# Patient Record
Sex: Female | Born: 1955 | Race: White | Hispanic: Yes | Marital: Married | Smoking: Never smoker
Health system: Southern US, Community
[De-identification: ages and names within clinical notes are randomized; demographics above are authoritative.]

## PROBLEM LIST (undated history)

## (undated) DIAGNOSIS — G8929 Other chronic pain: Secondary | ICD-10-CM

## (undated) DIAGNOSIS — R519 Headache, unspecified: Secondary | ICD-10-CM

## (undated) DIAGNOSIS — R51 Headache: Secondary | ICD-10-CM

## (undated) HISTORY — DX: Headache: R51

## (undated) HISTORY — DX: Other chronic pain: G89.29

## (undated) HISTORY — DX: Headache, unspecified: R51.9

---

## 2001-06-03 ENCOUNTER — Ambulatory Visit (HOSPITAL_COMMUNITY): Admission: RE | Admit: 2001-06-03 | Discharge: 2001-06-03 | Payer: Self-pay | Admitting: Family Medicine

## 2001-06-03 ENCOUNTER — Encounter: Payer: Self-pay | Admitting: Family Medicine

## 2003-12-22 ENCOUNTER — Ambulatory Visit: Payer: Self-pay | Admitting: *Deleted

## 2004-05-28 ENCOUNTER — Ambulatory Visit: Payer: Self-pay | Admitting: Family Medicine

## 2004-06-06 ENCOUNTER — Ambulatory Visit (HOSPITAL_COMMUNITY): Admission: RE | Admit: 2004-06-06 | Discharge: 2004-06-06 | Payer: Self-pay | Admitting: Family Medicine

## 2004-06-19 ENCOUNTER — Ambulatory Visit: Payer: Self-pay | Admitting: Family Medicine

## 2004-08-08 ENCOUNTER — Ambulatory Visit: Payer: Self-pay | Admitting: Family Medicine

## 2004-09-05 ENCOUNTER — Ambulatory Visit: Payer: Self-pay | Admitting: Family Medicine

## 2005-03-26 ENCOUNTER — Ambulatory Visit: Payer: Self-pay | Admitting: Family Medicine

## 2005-06-07 ENCOUNTER — Ambulatory Visit (HOSPITAL_COMMUNITY): Admission: RE | Admit: 2005-06-07 | Discharge: 2005-06-07 | Payer: Self-pay | Admitting: Internal Medicine

## 2006-05-06 ENCOUNTER — Ambulatory Visit: Payer: Self-pay | Admitting: Nurse Practitioner

## 2006-05-21 ENCOUNTER — Ambulatory Visit: Payer: Self-pay | Admitting: Family Medicine

## 2006-06-02 ENCOUNTER — Ambulatory Visit: Payer: Self-pay | Admitting: Family Medicine

## 2006-06-02 ENCOUNTER — Encounter (INDEPENDENT_AMBULATORY_CARE_PROVIDER_SITE_OTHER): Payer: Self-pay | Admitting: Family Medicine

## 2007-01-07 ENCOUNTER — Encounter (INDEPENDENT_AMBULATORY_CARE_PROVIDER_SITE_OTHER): Payer: Self-pay | Admitting: *Deleted

## 2007-04-29 ENCOUNTER — Ambulatory Visit (HOSPITAL_COMMUNITY): Admission: RE | Admit: 2007-04-29 | Discharge: 2007-04-29 | Payer: Self-pay | Admitting: Internal Medicine

## 2007-05-11 ENCOUNTER — Ambulatory Visit: Payer: Self-pay | Admitting: Internal Medicine

## 2007-06-01 ENCOUNTER — Ambulatory Visit: Payer: Self-pay | Admitting: Family Medicine

## 2007-06-30 ENCOUNTER — Ambulatory Visit: Payer: Self-pay | Admitting: Internal Medicine

## 2007-06-30 LAB — CONVERTED CEMR LAB
ALT: 14 units/L (ref 0–35)
AST: 15 units/L (ref 0–37)
Albumin: 4.5 g/dL (ref 3.5–5.2)
Alkaline Phosphatase: 72 units/L (ref 39–117)
BUN: 15 mg/dL (ref 6–23)
Basophils Absolute: 0 10*3/uL (ref 0.0–0.1)
Basophils Relative: 1 % (ref 0–1)
CO2: 25 meq/L (ref 19–32)
Calcium: 8.6 mg/dL (ref 8.4–10.5)
Chloride: 105 meq/L (ref 96–112)
Cholesterol: 210 mg/dL — ABNORMAL HIGH (ref 0–200)
Creatinine, Ser: 0.69 mg/dL (ref 0.40–1.20)
Eosinophils Absolute: 0.1 10*3/uL (ref 0.0–0.7)
Eosinophils Relative: 3 % (ref 0–5)
Glucose, Bld: 105 mg/dL — ABNORMAL HIGH (ref 70–99)
HCT: 41.4 % (ref 36.0–46.0)
HDL: 65 mg/dL (ref >39)
Hemoglobin: 13.5 g/dL (ref 12.0–15.0)
LDL Cholesterol: 132 mg/dL — ABNORMAL HIGH (ref 0–99)
Lymphocytes Relative: 38 % (ref 12–46)
Lymphs Abs: 1.5 10*3/uL (ref 0.7–4.0)
MCHC: 32.6 g/dL (ref 30.0–36.0)
MCV: 90.8 fL (ref 78.0–100.0)
Monocytes Absolute: 0.2 10*3/uL (ref 0.1–1.0)
Monocytes Relative: 6 % (ref 3–12)
Neutro Abs: 2 10*3/uL (ref 1.7–7.7)
Neutrophils Relative %: 53 % (ref 43–77)
Platelets: 214 10*3/uL (ref 150–400)
Potassium: 4.3 meq/L (ref 3.5–5.3)
RBC: 4.56 M/uL (ref 3.87–5.11)
RDW: 13.4 % (ref 11.5–15.5)
Sodium: 142 meq/L (ref 135–145)
TSH: 1.363 microintl units/mL (ref 0.350–5.50)
Total Bilirubin: 0.7 mg/dL (ref 0.3–1.2)
Total CHOL/HDL Ratio: 3.2
Total Protein: 7.4 g/dL (ref 6.0–8.3)
Triglycerides: 66 mg/dL (ref <150)
VLDL: 13 mg/dL (ref 0–40)
WBC: 3.8 10*3/uL — ABNORMAL LOW (ref 4.0–10.5)

## 2007-07-08 ENCOUNTER — Ambulatory Visit: Payer: Self-pay | Admitting: Internal Medicine

## 2012-05-27 ENCOUNTER — Other Ambulatory Visit: Payer: Self-pay | Admitting: Family Medicine

## 2012-05-27 ENCOUNTER — Other Ambulatory Visit (HOSPITAL_COMMUNITY)
Admission: RE | Admit: 2012-05-27 | Discharge: 2012-05-27 | Disposition: A | Discharge status: Home / Self Care | Payer: Commercial Managed Care - PPO | Source: Ambulatory Visit | Attending: Family Medicine | Admitting: Family Medicine

## 2012-05-27 DIAGNOSIS — Z1151 Encounter for screening for human papillomavirus (HPV): Secondary | ICD-10-CM | POA: Insufficient documentation

## 2012-05-27 DIAGNOSIS — Z124 Encounter for screening for malignant neoplasm of cervix: Secondary | ICD-10-CM | POA: Insufficient documentation

## 2012-05-28 ENCOUNTER — Other Ambulatory Visit: Payer: Self-pay | Admitting: Family Medicine

## 2012-05-28 DIAGNOSIS — R102 Pelvic and perineal pain: Secondary | ICD-10-CM

## 2012-06-01 ENCOUNTER — Ambulatory Visit
Admission: RE | Admit: 2012-06-01 | Discharge: 2012-06-01 | Disposition: A | Discharge status: Home / Self Care | Payer: Commercial Managed Care - PPO | Source: Ambulatory Visit | Attending: Family Medicine | Admitting: Family Medicine

## 2012-06-01 DIAGNOSIS — R102 Pelvic and perineal pain: Secondary | ICD-10-CM

## 2012-07-21 ENCOUNTER — Other Ambulatory Visit: Payer: Self-pay | Admitting: Obstetrics and Gynecology

## 2014-06-22 ENCOUNTER — Other Ambulatory Visit: Payer: Self-pay | Admitting: Family Medicine

## 2014-06-22 DIAGNOSIS — Z1231 Encounter for screening mammogram for malignant neoplasm of breast: Secondary | ICD-10-CM

## 2014-06-27 ENCOUNTER — Ambulatory Visit
Admission: RE | Admit: 2014-06-27 | Discharge: 2014-06-27 | Disposition: A | Discharge status: Home / Self Care | Payer: Commercial Managed Care - PPO | Source: Ambulatory Visit | Attending: Family Medicine | Admitting: Family Medicine

## 2014-06-27 DIAGNOSIS — Z1231 Encounter for screening mammogram for malignant neoplasm of breast: Secondary | ICD-10-CM

## 2015-03-07 ENCOUNTER — Other Ambulatory Visit: Payer: Self-pay | Admitting: General Surgery

## 2015-03-07 DIAGNOSIS — R1031 Right lower quadrant pain: Secondary | ICD-10-CM

## 2015-03-14 ENCOUNTER — Ambulatory Visit
Admission: RE | Admit: 2015-03-14 | Discharge: 2015-03-14 | Disposition: A | Discharge status: Home / Self Care | Payer: Commercial Managed Care - PPO | Source: Ambulatory Visit | Attending: General Surgery | Admitting: General Surgery

## 2015-03-14 DIAGNOSIS — R1031 Right lower quadrant pain: Secondary | ICD-10-CM

## 2018-06-01 ENCOUNTER — Emergency Department (HOSPITAL_BASED_OUTPATIENT_CLINIC_OR_DEPARTMENT_OTHER): Payer: Commercial Managed Care - PPO

## 2018-06-01 ENCOUNTER — Emergency Department (HOSPITAL_BASED_OUTPATIENT_CLINIC_OR_DEPARTMENT_OTHER)
Admission: EM | Admit: 2018-06-01 | Discharge: 2018-06-01 | Disposition: A | Discharge status: Home / Self Care | Payer: Commercial Managed Care - PPO | Attending: Emergency Medicine | Admitting: Emergency Medicine

## 2018-06-01 ENCOUNTER — Other Ambulatory Visit: Payer: Self-pay

## 2018-06-01 ENCOUNTER — Encounter (HOSPITAL_BASED_OUTPATIENT_CLINIC_OR_DEPARTMENT_OTHER): Payer: Self-pay | Admitting: *Deleted

## 2018-06-01 DIAGNOSIS — R51 Headache: Secondary | ICD-10-CM | POA: Diagnosis not present

## 2018-06-01 DIAGNOSIS — R079 Chest pain, unspecified: Secondary | ICD-10-CM

## 2018-06-01 DIAGNOSIS — R0789 Other chest pain: Secondary | ICD-10-CM | POA: Diagnosis not present

## 2018-06-01 DIAGNOSIS — R9431 Abnormal electrocardiogram [ECG] [EKG]: Secondary | ICD-10-CM | POA: Diagnosis not present

## 2018-06-01 DIAGNOSIS — I4581 Long QT syndrome: Secondary | ICD-10-CM | POA: Diagnosis not present

## 2018-06-01 DIAGNOSIS — R519 Headache, unspecified: Secondary | ICD-10-CM

## 2018-06-01 LAB — CBC
HEMATOCRIT: 41.7 % (ref 36.0–46.0)
HEMOGLOBIN: 13.2 g/dL (ref 12.0–15.0)
MCH: 28.6 pg (ref 26.0–34.0)
MCHC: 31.7 g/dL (ref 30.0–36.0)
MCV: 90.3 fL (ref 80.0–100.0)
Platelets: 263 10*3/uL (ref 150–400)
RBC: 4.62 MIL/uL (ref 3.87–5.11)
RDW: 12.5 % (ref 11.5–15.5)
WBC: 5.8 10*3/uL (ref 4.0–10.5)
nRBC: 0 % (ref 0.0–0.2)

## 2018-06-01 LAB — BASIC METABOLIC PANEL
Anion gap: 7 (ref 5–15)
BUN: 16 mg/dL (ref 8–23)
CO2: 24 mmol/L (ref 22–32)
CREATININE: 0.63 mg/dL (ref 0.44–1.00)
Calcium: 8.9 mg/dL (ref 8.9–10.3)
Chloride: 104 mmol/L (ref 98–111)
GFR calc Af Amer: >60 mL/min (ref >60)
GFR calc non Af Amer: >60 mL/min (ref >60)
GLUCOSE: 107 mg/dL — AB (ref 70–99)
POTASSIUM: 3.8 mmol/L (ref 3.5–5.1)
SODIUM: 135 mmol/L (ref 135–145)

## 2018-06-01 LAB — TROPONIN I: Troponin I: <0.03 ng/mL (ref <0.03)

## 2018-06-01 MED ORDER — PROCHLORPERAZINE MALEATE 10 MG PO TABS
10.0000 mg | ORAL_TABLET | Freq: Once | ORAL | Status: AC
  Administered 2018-06-01: 10 mg via ORAL
  Filled 2018-06-01: qty 1

## 2018-06-01 MED ORDER — SODIUM CHLORIDE 0.9% FLUSH
3.0000 mL | Freq: Once | INTRAVENOUS | Status: DC
  Filled 2018-06-01: qty 3

## 2018-06-01 MED ORDER — DIPHENHYDRAMINE HCL 25 MG PO CAPS
25.0000 mg | ORAL_CAPSULE | Freq: Once | ORAL | Status: AC
  Administered 2018-06-01: 25 mg via ORAL
  Filled 2018-06-01: qty 1

## 2018-06-01 NOTE — ED Triage Notes (Signed)
Chest pain on and off x 6 months. She was seen at [facility] today and told to come back in 3 days for a recheck. Son asked for a cardiology referral and they were told to come the the ED.

## 2018-06-01 NOTE — ED Provider Notes (Signed)
[facility] EMERGENCY DEPARTMENT Provider Note   CSN: [id number] Arrival date & time: 06/01/18  1853     History   Chief Complaint Chief Complaint  Patient presents with  . Chest Pain    HPI Dawn Mayer is a 63 y.o. female.  HPI  Started early Sunday, was having migraine, headache, nausea, and then began to have chest pain from right shoulder to middle of the chest, like a pressure Felt like a pressure that was there for a few hours Came back a little bit again, was working and felt it a little bit and then it went away Has had this on and off for a while  Headache persistent, not had like this before, has had headaches her whole life, this one began Sunday woke her up, has been decreasing, mild right now   No current chest pain  CP has been on and off for months, maybe a year. Sometimes will last 4 days then go away.  Thinks maybe it is stress.  Has not seen Cardiologist No smoking, many years ago, no other drugs  Father had died in accident. Mom no cardiac disease. No other medical problems.   History reviewed. No pertinent past medical history.  There are no active problems to display for this patient.   History reviewed. No pertinent surgical history.   OB History   No obstetric history on file.      Home Medications    Prior to Admission medications   Not on File    Family History No family history on file.  Social History Social History   Tobacco Use  . Smoking status: Never Smoker  . Smokeless tobacco: Never Used  Substance Use Topics  . Alcohol use: Never    Frequency: Never  . Drug use: Never     Allergies   Patient has no known allergies.   Review of Systems Review of Systems  Constitutional: Negative for fever.  HENT: Negative for sore throat.   Eyes: Negative for visual disturbance.  Respiratory: Negative for cough and shortness of breath.   Cardiovascular: Positive for chest pain.  Gastrointestinal: Positive  for nausea and vomiting. Negative for abdominal pain.  Genitourinary: Negative for difficulty urinating.  Musculoskeletal: Negative for back pain and neck pain.  Skin: Negative for rash.  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 Negative for syncope.     Physical Exam Updated Vital Signs BP 140/78   Pulse 82   Temp 98.2 F (36.8 C) (Oral)   Resp 18   Ht 5\' 4"  (1.626 m)   Wt 71.7 kg   SpO2 100%   BMI 27.12 kg/m   Physical Exam Vitals signs and nursing note reviewed.  Constitutional:      General: She is not in acute distress.    Appearance: She is well-developed. She is not diaphoretic.  HENT:     Head: Normocephalic and atraumatic.  Eyes:     Conjunctiva/sclera: Conjunctivae normal.  Neck:     Musculoskeletal: Normal range of motion.  Cardiovascular:     Rate and Rhythm: Normal rate and regular rhythm.     Heart sounds: Normal heart sounds. No murmur. No friction rub. No gallop.   Pulmonary:     Effort: Pulmonary effort is normal. No respiratory distress.     Breath sounds: Normal breath sounds. No wheezing or rales.  Abdominal:     General: There is no distension.     Palpations: Abdomen is soft.  Tenderness: There is no abdominal tenderness. There is no guarding.  Musculoskeletal:        General: No tenderness.  Skin:    General: Skin is warm and dry.     Findings: No erythema or rash.  Neurological:     Mental Status: She is alert and oriented to person, place, and time.      ED Treatments / Results  Labs (all labs ordered are listed, but only abnormal results are displayed) Labs Reviewed  BASIC METABOLIC PANEL - Abnormal; Notable for the following components:      Result Value   Glucose, Bld 107 (*)    All other components within normal limits  CBC  TROPONIN I    EKG EKG Interpretation  Date/Time:  Monday June 01 2018 19:11:58 EST Ventricular Rate:  73 PR Interval:  150 QRS Duration: 78 QT Interval:  428 QTC  Calculation: 471 R Axis:   -70 Text Interpretation:  Normal sinus rhythm Left anterior fascicular block Possible Lateral infarct , age undetermined Abnormal ECG No previous ECGs available Confirmed by Alvira Monday ([id number]) on 06/01/2018 7:24:15 PM Also confirmed by Alvira Monday ([id number]), editor Elita Quick ([id number])  on 06/02/2018 7:07:08 AM   Radiology Dg Chest 2 View  Result Date: 06/01/2018 CLINICAL DATA:  Intermittent left-sided chest pain. EXAM: CHEST - 2 VIEW COMPARISON:  None. FINDINGS: Cardiomediastinal silhouette is normal. Mediastinal contours appear intact. There is no evidence of focal airspace consolidation, pleural effusion or pneumothorax. Osseous structures are without acute abnormality. Soft tissues are grossly normal. IMPRESSION: No active cardiopulmonary disease. Electronically Signed   By: Ted Mcalpine M.D.   On: 06/01/2018 19:42    Procedures Procedures (including critical care time)  Medications Ordered in ED Medications  prochlorperazine (COMPAZINE) tablet 10 mg (10 mg Oral Given 06/01/18 2213)  diphenhydrAMINE (BENADRYL) capsule 25 mg (25 mg Oral Given 06/01/18 2213)     Initial Impression / Assessment and Plan / ED Course  I have reviewed the triage vital signs and the nursing notes.  Pertinent labs & imaging results that were available during my care of the patient were reviewed by me and considered in my medical decision making (see chart for details).     63yo female with no significant medical history presents with concern for chest pain and headaches.  Headaches have been present for life, history not consistent with SAH, no trauma doubt SDH or other ICH.  No fever or signs of meningitis. Normal neurologic exam.  GIven po headache cocktail.  Also presents with atypical chest pain. ECG without signs of pericarditis or acute ST changes. Troponin negative.  No signs of pneumonia, pneumothorax. History and exam not consistent with aortic  dissection or PE.   Possible MSK pain related to right shoulder, however would consider further outpt cardiac evaluation.  Patient discharged in stable condition with understanding of reasons to return.      Final Clinical Impressions(s) / ED Diagnoses   Final diagnoses:  Acute nonintractable headache, unspecified headache type  Chest pain, unspecified type    ED Discharge Orders    None       Alvira Monday, MD 06/02/18 1034

## 2018-06-03 NOTE — Progress Notes (Signed)
Cardiology Office Note   Date:  06/04/2018   ID:  Dawn Mayer, DOB 06/22/1955, MRN [id number]  PCP:  Patient, No Pcp Per  Cardiologist:   Charlton Haws, MD   No chief complaint on file.     History of Present Illness: Dawn Mayer is a 63 y.o. female who presents for consultation regarding chest pain Referred by [facility] ER Dr Dalene Seltzer. Reviewed visit from 06/01/18 Started having migraine and headache with nausea then atypical chest pain mostly in right shoulder Pressure not sharp. Lasted a few hours and resolved spontaneously Has had pains in chest for over a year Thinks it's related to stress  She r/o with no acute ECG changes and NAD on CXR told to f/u with cardiology   From [place] has had headaches her whole life No previous cardiac issues  Walks without limitation Has 4 children  Husband sees Dr Dawn Mayer for his heart issues and MI  Past Medical History:  Diagnosis Date  . Chronic headaches     History reviewed. No pertinent surgical history.   No current outpatient medications on file.   No current facility-administered medications for this visit.     Allergies:   Patient has no known allergies.    Social History:  The patient  reports that she has never smoked. She has never used smokeless tobacco. She reports that she does not drink alcohol or use drugs.   Family History:  The patient's family history is not on file.    ROS:  Please see the history of present illness.   Otherwise, review of systems are positive for none.   All other systems are reviewed and negative.    PHYSICAL EXAM: VS:  BP 124/84   Pulse 76   Ht 5\' 4"  (1.626 m)   Wt 160 lb (72.6 kg)   SpO2 93%   BMI 27.46 kg/m  , BMI Body mass index is 27.46 kg/m. Affect appropriate Healthy:  appears stated age HEENT: normal Neck supple with no adenopathy JVP normal no bruits no thyromegaly Lungs clear with no wheezing and good diaphragmatic motion Heart:  S1/S2 no murmur, no rub, gallop or  click PMI normal Abdomen: benighn, BS positve, no tenderness, no AAA no bruit.  No HSM or HJR Distal pulses intact with no bruits No edema Neuro non-focal Skin warm and dry No muscular weakness    EKG:  SR LAFB poor R wave progression low voltage    Recent Labs: 06/01/2018: BUN 16; Creatinine, Ser 0.63; Hemoglobin 13.2; Platelets 263; Potassium 3.8; Sodium 135    Lipid Panel    Component Value Date/Time   CHOL 210 (H) 06/30/2007 2056   TRIG 66 06/30/2007 2056   HDL 65 06/30/2007 2056   CHOLHDL 3.2 Ratio 06/30/2007 2056   VLDL 13 06/30/2007 2056   LDLCALC 132 (H) 06/30/2007 2056      Wt Readings from Last 3 Encounters:  06/04/18 160 lb (72.6 kg)  06/01/18 158 lb (71.7 kg)      Other studies Reviewed: Additional studies/ records that were reviewed today include: Notes from ER, labs, ECG CXR .    ASSESSMENT AND PLAN:  1.  Chest Pain : abnormal ECG r/o atypical f/u myovue and TTE 2.  Headache:  F/u primary ? Tension no need for CT/MRI per ER Will check carotids given ? Migraines and daughter request Should consider  Seeing neurology and starting Topamax     Current medicines are reviewed at length with the patient today.  The patient does not have concerns regarding medicines.  The following changes have been made:  no change  Labs/ tests ordered today include: Myovue carotid and TTE   Orders Placed This Encounter  Procedures  . MYOCARDIAL PERFUSION IMAGING  . ECHOCARDIOGRAM COMPLETE     Disposition:   FU with cardiology PRN      Signed, Charlton Haws, MD  06/04/2018 3:53 PM    [facility] [address] [place], [place], [place]  [address] Phone: [phone]; Fax: [phone]

## 2018-06-04 ENCOUNTER — Encounter: Payer: Self-pay | Admitting: Cardiovascular Disease

## 2018-06-04 ENCOUNTER — Ambulatory Visit (INDEPENDENT_AMBULATORY_CARE_PROVIDER_SITE_OTHER): Payer: Commercial Managed Care - PPO | Admitting: Cardiovascular Disease

## 2018-06-04 VITALS — BP 124/84 | HR 76 | Ht 64.0 in | Wt 160.0 lb

## 2018-06-04 DIAGNOSIS — R079 Chest pain, unspecified: Secondary | ICD-10-CM | POA: Diagnosis not present

## 2018-06-04 DIAGNOSIS — R9431 Abnormal electrocardiogram [ECG] [EKG]: Secondary | ICD-10-CM

## 2018-06-04 DIAGNOSIS — G44201 Tension-type headache, unspecified, intractable: Secondary | ICD-10-CM

## 2018-06-04 DIAGNOSIS — G459 Transient cerebral ischemic attack, unspecified: Secondary | ICD-10-CM | POA: Diagnosis not present

## 2018-06-04 NOTE — Patient Instructions (Addendum)
Medication Instructions:   If you need a refill on your cardiac medications before your next appointment, please call your pharmacy.   Lab work:  If you have labs (blood work) drawn today and your tests are completely normal, you will receive your results only by: Marland Kitchen MyChart Message (if you have MyChart) OR . A paper copy in the mail If you have any lab test that is abnormal or we need to change your treatment, we will call you to review the results.  Testing/Procedures: Your physician has requested that you have a carotid duplex. This test is an ultrasound of the carotid arteries in your neck. It looks at blood flow through these arteries that supply the brain with blood. Allow one hour for this exam. There are no restrictions or special instructions.  Your physician has requested that you have an echocardiogram. Echocardiography is a painless test that uses sound waves to create images of your heart. It provides your doctor with information about the size and shape of your heart and how well your heart's chambers and valves are working. This procedure takes approximately one hour. There are no restrictions for this procedure.  Your physician has requested that you have en exercise stress myoview. For further information please visit [email or web address] Please follow instruction sheet, as given.   Follow-Up: At [facility], you and your health needs are our priority.  As part of our continuing mission to provide you with exceptional heart care, we have created designated Provider Care Teams.  These Care Teams include your primary Cardiologist (physician) and Advanced Practice Providers (APPs -  Physician Assistants and Nurse Practitioners) who all work together to provide you with the care you need, when you need it. Your physician recommends that you schedule a follow-up appointment as needed with Dr. Eden Emms.

## 2018-06-08 ENCOUNTER — Encounter: Payer: Self-pay | Admitting: Cardiovascular Disease

## 2018-06-24 DIAGNOSIS — Z79899 Other long term (current) drug therapy: Secondary | ICD-10-CM | POA: Diagnosis not present

## 2018-06-24 DIAGNOSIS — K649 Unspecified hemorrhoids: Secondary | ICD-10-CM | POA: Diagnosis not present

## 2018-06-29 ENCOUNTER — Ambulatory Visit (HOSPITAL_COMMUNITY)
Admission: RE | Admit: 2018-06-29 | Discharge: 2018-06-29 | Disposition: A | Discharge status: Home / Self Care | Payer: Commercial Managed Care - PPO | Source: Ambulatory Visit | Attending: Cardiovascular Disease | Admitting: Cardiovascular Disease

## 2018-06-29 DIAGNOSIS — G459 Transient cerebral ischemic attack, unspecified: Secondary | ICD-10-CM

## 2018-06-29 DIAGNOSIS — G44201 Tension-type headache, unspecified, intractable: Secondary | ICD-10-CM | POA: Insufficient documentation

## 2018-06-29 DIAGNOSIS — R079 Chest pain, unspecified: Secondary | ICD-10-CM | POA: Diagnosis not present

## 2018-06-29 DIAGNOSIS — R9431 Abnormal electrocardiogram [ECG] [EKG]: Secondary | ICD-10-CM | POA: Insufficient documentation

## 2018-07-01 ENCOUNTER — Other Ambulatory Visit (HOSPITAL_COMMUNITY): Payer: Commercial Managed Care - PPO

## 2018-07-02 ENCOUNTER — Ambulatory Visit (HOSPITAL_COMMUNITY): Payer: Commercial Managed Care - PPO | Attending: Cardiovascular Disease

## 2018-07-02 ENCOUNTER — Other Ambulatory Visit: Payer: Self-pay

## 2018-07-02 ENCOUNTER — Encounter (HOSPITAL_COMMUNITY): Payer: Commercial Managed Care - PPO

## 2018-07-02 DIAGNOSIS — G44201 Tension-type headache, unspecified, intractable: Secondary | ICD-10-CM | POA: Insufficient documentation

## 2018-07-02 DIAGNOSIS — R9431 Abnormal electrocardiogram [ECG] [EKG]: Secondary | ICD-10-CM | POA: Diagnosis not present

## 2018-07-02 DIAGNOSIS — R079 Chest pain, unspecified: Secondary | ICD-10-CM | POA: Diagnosis not present

## 2018-07-02 DIAGNOSIS — G459 Transient cerebral ischemic attack, unspecified: Secondary | ICD-10-CM | POA: Insufficient documentation

## 2018-07-06 ENCOUNTER — Encounter (HOSPITAL_COMMUNITY): Payer: Commercial Managed Care - PPO

## 2018-07-06 ENCOUNTER — Telehealth: Payer: Self-pay | Admitting: Cardiovascular Disease

## 2018-07-06 NOTE — Telephone Encounter (Signed)
Patient's daughter [facility]) aware of results.

## 2018-07-06 NOTE — Telephone Encounter (Signed)
Wendall Stade, MD  Ethelda Chick, RN        Plaque no stenosis f/u carotid duplex in 2 years    Notes recorded by Wendall Stade, MD on 07/06/2018 at 8:20 AM EDT EF normal no bad valves overall good

## 2018-07-06 NOTE — Telephone Encounter (Signed)
Daughter of Pt, Taber Sankey called returning Pam's call regarding test results. Please call her back

## 2019-03-29 ENCOUNTER — Other Ambulatory Visit: Payer: Self-pay | Admitting: Family Medicine

## 2019-03-29 DIAGNOSIS — Z1231 Encounter for screening mammogram for malignant neoplasm of breast: Secondary | ICD-10-CM

## 2019-05-07 ENCOUNTER — Other Ambulatory Visit: Payer: Self-pay | Admitting: Family Medicine

## 2019-05-24 ENCOUNTER — Ambulatory Visit
Admission: RE | Admit: 2019-05-24 | Discharge: 2019-05-24 | Disposition: A | Discharge status: Home / Self Care | Payer: Commercial Managed Care - PPO | Source: Ambulatory Visit | Attending: Family Medicine | Admitting: Family Medicine

## 2019-05-24 ENCOUNTER — Other Ambulatory Visit: Payer: Self-pay

## 2019-05-24 DIAGNOSIS — Z1231 Encounter for screening mammogram for malignant neoplasm of breast: Secondary | ICD-10-CM

## 2019-06-03 ENCOUNTER — Other Ambulatory Visit: Payer: Self-pay | Admitting: Family Medicine

## 2021-04-25 IMAGING — MG DIGITAL SCREENING BILAT W/ TOMO W/ CAD
8 series · 8 of 24 positions shown · non-contrast
Comparison: Previous exam(s).

CLINICAL DATA: Screening.

EXAM:
DIGITAL SCREENING BILATERAL MAMMOGRAM WITH TOMO AND CAD

[L MLO synth-2D]
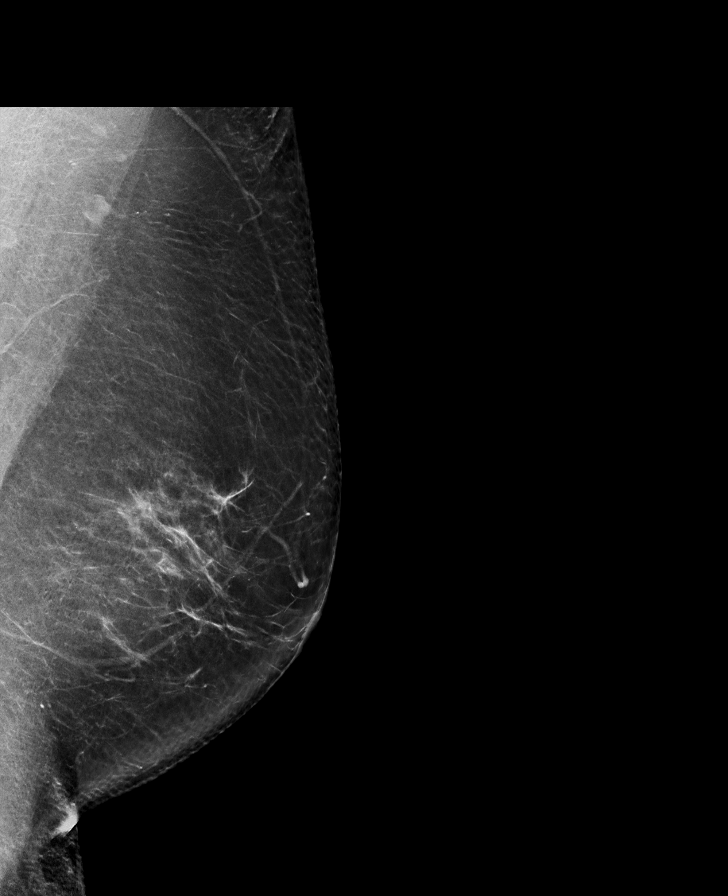

[L CC synth-2D]
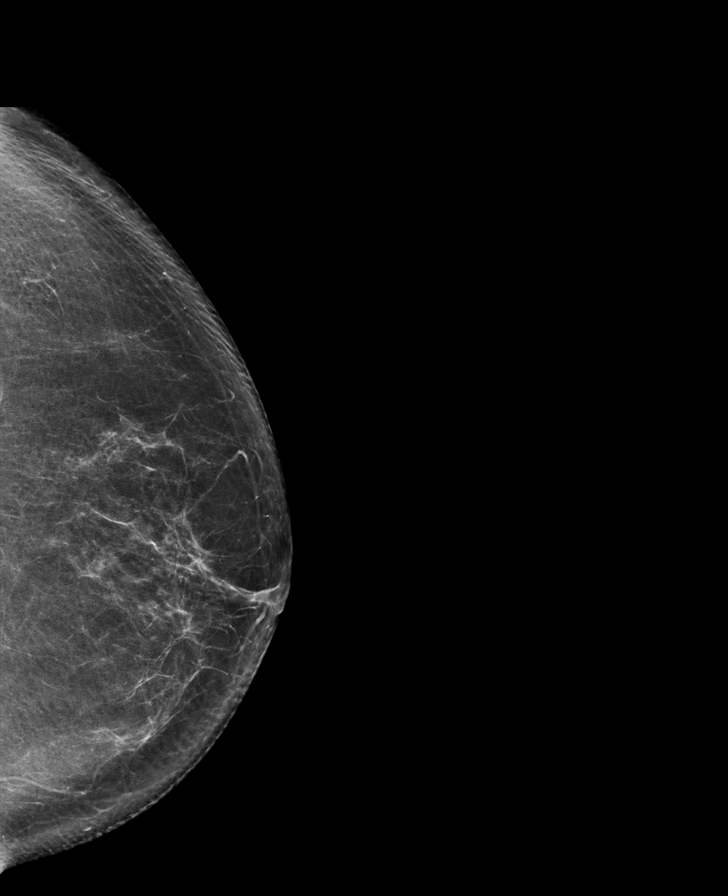

[R MLO synth-2D]
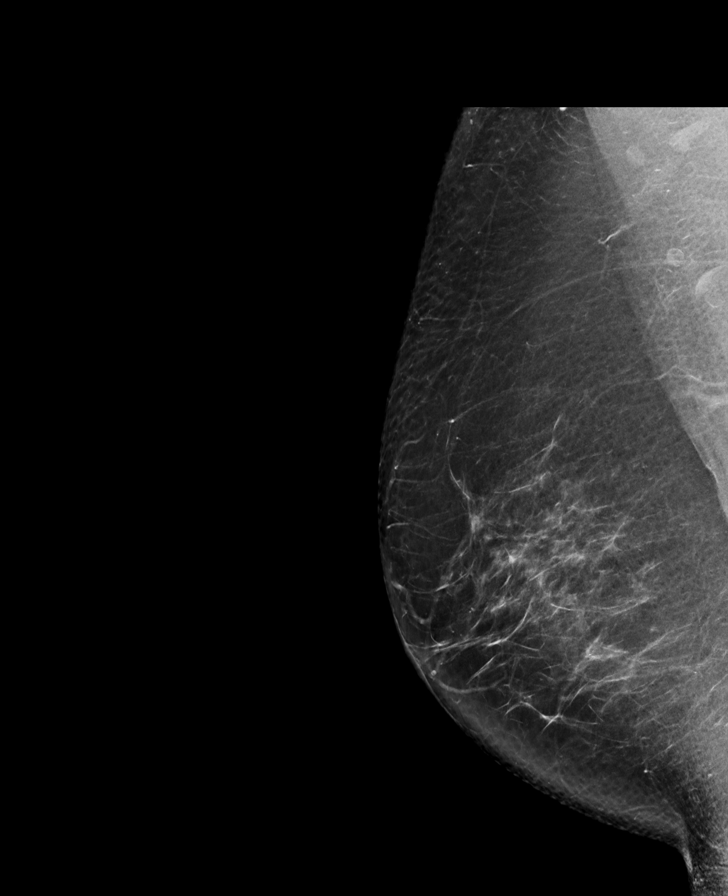

[R CC synth-2D]
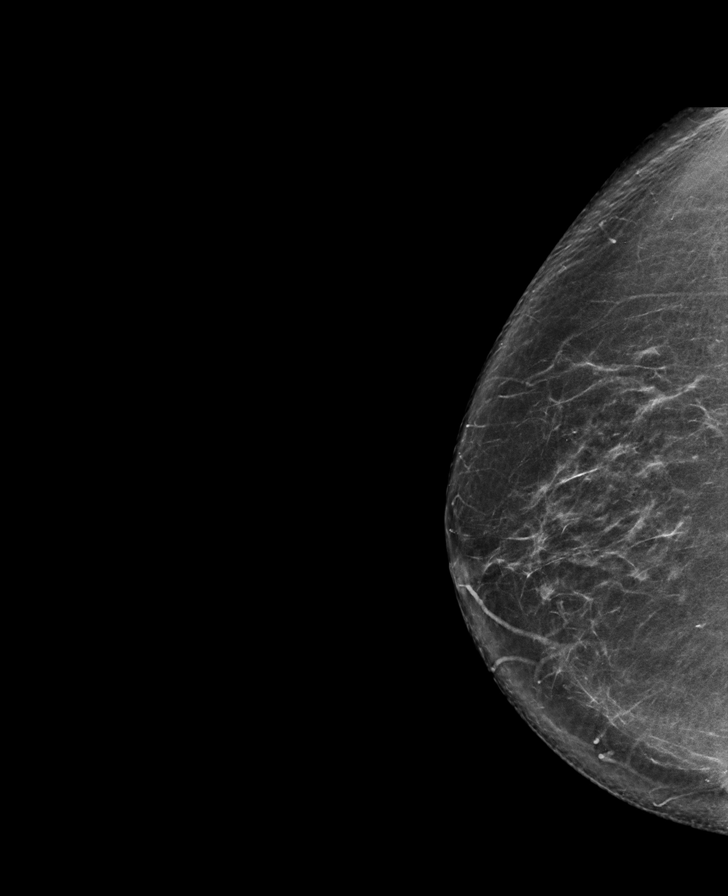

[L MLO tomo · tomo slice 47/93.0]
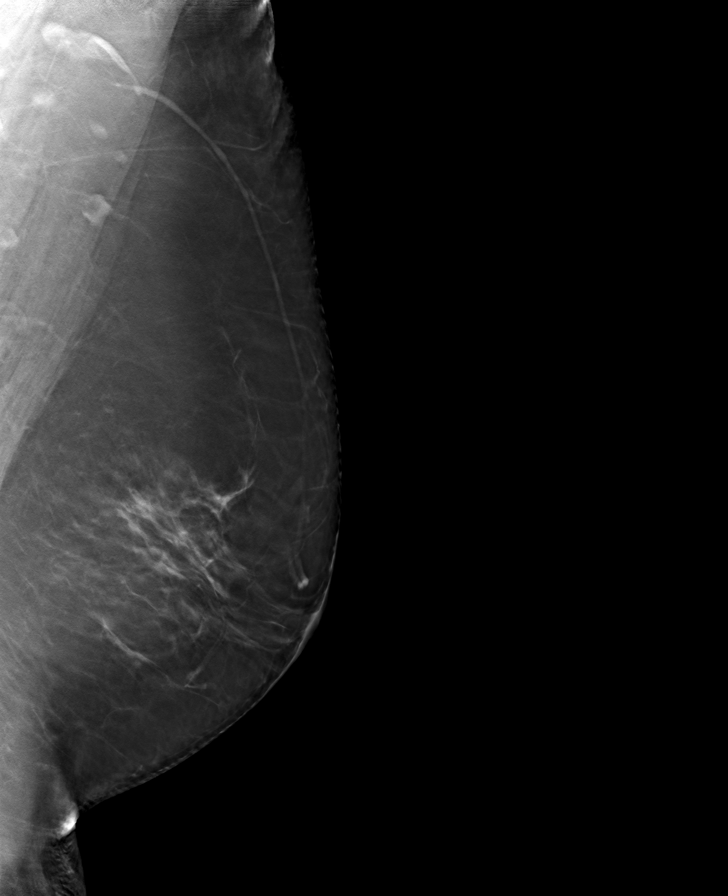

[R CC tomo · tomo slice 43/85.0]
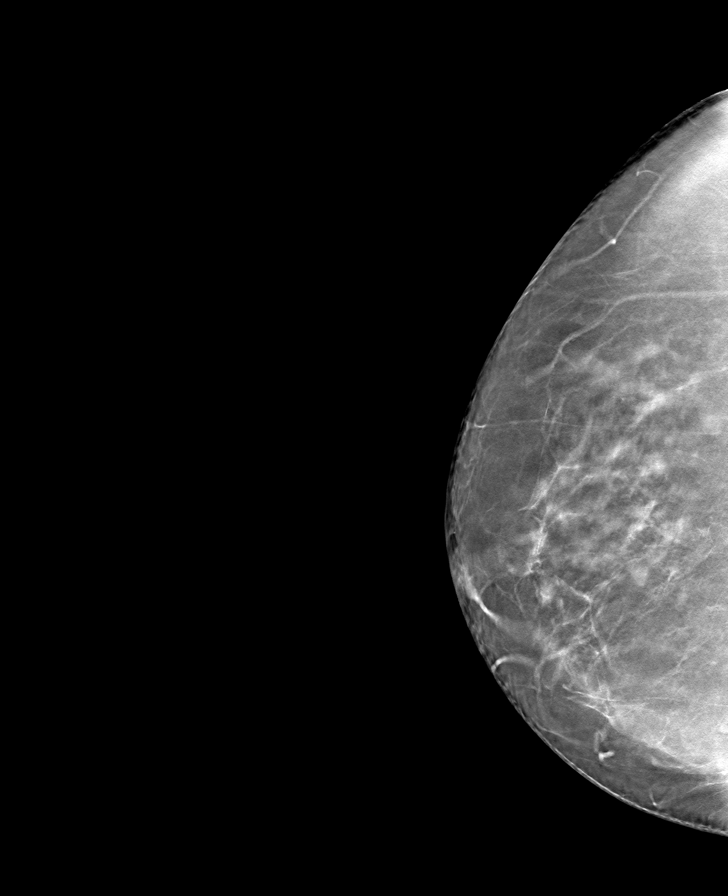

[L CC tomo · tomo slice 43/85.0]
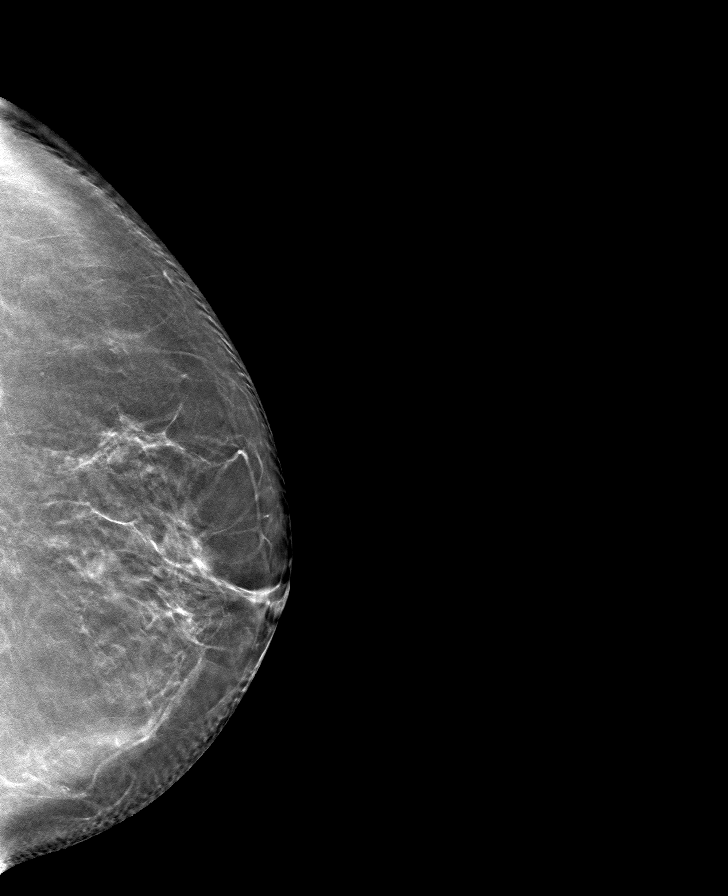

[R MLO tomo · tomo slice 47/93.0]
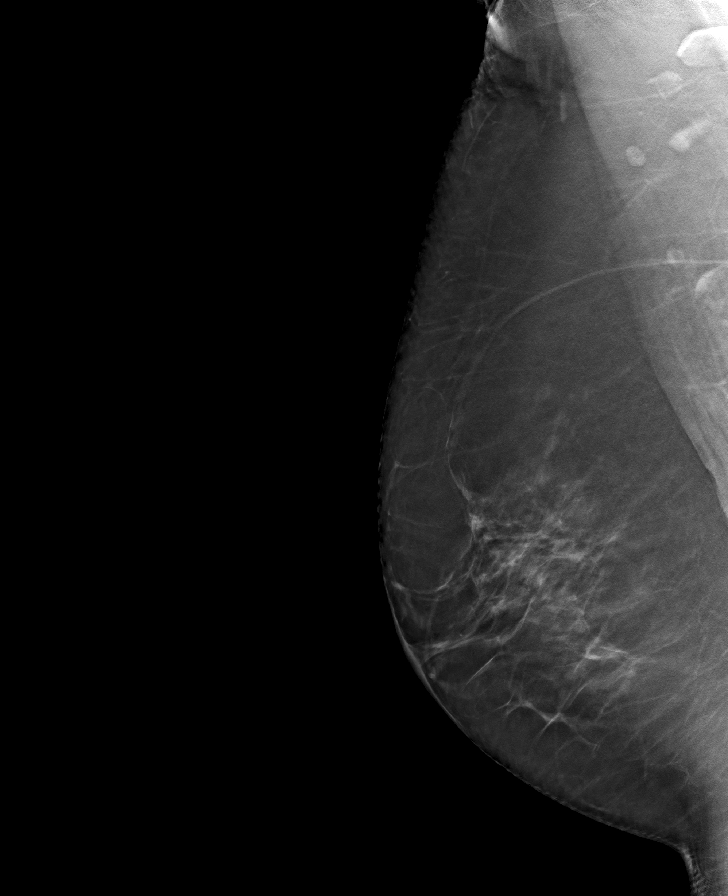

[8 of 24 positions shown; findings below may reference images not displayed]

ACR Breast Density Category b: There are scattered areas of
fibroglandular density.
FINDINGS: There are no findings suspicious for malignancy. Images were
processed with CAD.
IMPRESSION: No mammographic evidence of malignancy. A result letter of this
screening mammogram will be mailed directly to the patient.

RECOMMENDATION:
Screening mammogram in one year. (Code:[id number])

BI-RADS CATEGORY  1: Negative.

## 2021-11-07 NOTE — Telephone Encounter (Signed)
error
